# Patient Record
Sex: Male | Born: 1990 | Race: White | Hispanic: No | Marital: Single | Smoking: Never smoker
Health system: Southern US, Community
[De-identification: ages and names within clinical notes are randomized; demographics above are authoritative.]

---
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
## 2016-10-13 ENCOUNTER — Other Ambulatory Visit
Admission: RE | Admit: 2016-10-13 | Discharge: 2016-10-13 | Disposition: A | Discharge status: Home / Self Care | Payer: BLUE CROSS/BLUE SHIELD | Source: Ambulatory Visit | Attending: Gastroenterology | Admitting: Gastroenterology

## 2016-10-13 DIAGNOSIS — R197 Diarrhea, unspecified: Secondary | ICD-10-CM | POA: Diagnosis present

## 2016-10-13 LAB — GASTROINTESTINAL PANEL BY PCR, STOOL (REPLACES STOOL CULTURE)
Adenovirus F40/41: NOT DETECTED
Astrovirus: NOT DETECTED
CRYPTOSPORIDIUM: NOT DETECTED
CYCLOSPORA CAYETANENSIS: NOT DETECTED
Campylobacter species: NOT DETECTED
ENTAMOEBA HISTOLYTICA: NOT DETECTED
ENTEROAGGREGATIVE E COLI (EAEC): NOT DETECTED
Enteropathogenic E coli (EPEC): NOT DETECTED
Enterotoxigenic E coli (ETEC): NOT DETECTED
GIARDIA LAMBLIA: NOT DETECTED
NOROVIRUS GI/GII: NOT DETECTED
Plesimonas shigelloides: NOT DETECTED
Rotavirus A: NOT DETECTED
SALMONELLA SPECIES: NOT DETECTED
SAPOVIRUS (I, II, IV, AND V): NOT DETECTED
SHIGELLA/ENTEROINVASIVE E COLI (EIEC): NOT DETECTED
Shiga like toxin producing E coli (STEC): NOT DETECTED
VIBRIO CHOLERAE: NOT DETECTED
VIBRIO SPECIES: NOT DETECTED
YERSINIA ENTEROCOLITICA: NOT DETECTED

## 2016-10-13 LAB — C DIFFICILE QUICK SCREEN W PCR REFLEX
C DIFFICILE (CDIFF) TOXIN: NEGATIVE
C Diff antigen: NEGATIVE
C Diff interpretation: NOT DETECTED

## 2017-12-27 ENCOUNTER — Emergency Department: Payer: BLUE CROSS/BLUE SHIELD

## 2017-12-27 ENCOUNTER — Encounter: Payer: Self-pay | Admitting: Emergency Medicine

## 2017-12-27 ENCOUNTER — Ambulatory Visit: Payer: Self-pay

## 2017-12-27 ENCOUNTER — Emergency Department
Admission: EM | Admit: 2017-12-27 | Discharge: 2017-12-27 | Disposition: A | Discharge status: Home / Self Care | Payer: BLUE CROSS/BLUE SHIELD | Attending: Emergency Medicine | Admitting: Emergency Medicine

## 2017-12-27 DIAGNOSIS — G43909 Migraine, unspecified, not intractable, without status migrainosus: Secondary | ICD-10-CM | POA: Diagnosis not present

## 2017-12-27 DIAGNOSIS — G4751 Confusional arousals: Secondary | ICD-10-CM | POA: Insufficient documentation

## 2017-12-27 DIAGNOSIS — R51 Headache: Secondary | ICD-10-CM | POA: Diagnosis present

## 2017-12-27 DIAGNOSIS — G43109 Migraine with aura, not intractable, without status migrainosus: Secondary | ICD-10-CM

## 2017-12-27 LAB — TROPONIN I
TROPONIN I: 0.03 ng/mL — AB (ref <0.03)
TROPONIN I: 0.03 ng/mL — AB (ref <0.03)

## 2017-12-27 LAB — CBC
HCT: 42.1 % (ref 40.0–52.0)
HEMOGLOBIN: 14.6 g/dL (ref 13.0–18.0)
MCH: 30.7 pg (ref 26.0–34.0)
MCHC: 34.8 g/dL (ref 32.0–36.0)
MCV: 88.1 fL (ref 80.0–100.0)
Platelets: 242 10*3/uL (ref 150–440)
RBC: 4.77 MIL/uL (ref 4.40–5.90)
RDW: 12.3 % (ref 11.5–14.5)
WBC: 6 10*3/uL (ref 3.8–10.6)

## 2017-12-27 LAB — BASIC METABOLIC PANEL
Anion gap: 8 (ref 5–15)
BUN: 20 mg/dL (ref 6–20)
CALCIUM: 9 mg/dL (ref 8.9–10.3)
CO2: 26 mmol/L (ref 22–32)
Chloride: 103 mmol/L (ref 101–111)
Creatinine, Ser: 1.15 mg/dL (ref 0.61–1.24)
GFR calc Af Amer: >60 mL/min (ref >60)
GLUCOSE: 126 mg/dL — AB (ref 65–99)
Potassium: 3.9 mmol/L (ref 3.5–5.1)
SODIUM: 137 mmol/L (ref 135–145)

## 2017-12-27 NOTE — Telephone Encounter (Signed)
Pt. Reports 2 days ago had an episode of dizziness, confusion, numbness and could not grasp things on the left side.Had blurred vision and headache. States "it lasted about 1 hour." No symptoms today. Pt. Instructed to go to ED for evaluation. Verbalizes understanding.  Reason for Disposition . [1] Weakness (i.e., paralysis, loss of muscle strength) of the face, arm / hand, or leg / foot on one side of the body AND [2] sudden onset AND [3] brief (now gone)  Answer Assessment - Initial Assessment Questions 1. SYMPTOM: "What is the main symptom you are concerned about?" (e.g., weakness, numbness)     Numbness on left side - could not grab anything . 2 days ago 2. ONSET: "When did this start?" (minutes, hours, days; while sleeping)     2 days ago 3. LAST NORMAL: "When was the last time you were normal (no symptoms)?"     3 days ago 4. PATTERN "Does this come and go, or has it been constant since it started?"  "Is it present now?"     Constant - lasted for 1 hour 5. CARDIAC SYMPTOMS: "Have you had any of the following symptoms: chest pain, difficulty breathing, palpitations?"     No 6. NEUROLOGIC SYMPTOMS: "Have you had any of the following symptoms: headache, dizziness, vision loss, double vision, changes in speech, unsteady on your feet?"     Had headache, dizzy,vision change - things were blurry. 7. OTHER SYMPTOMS: "Do you have any other symptoms?"     No 8. PREGNANCY: "Is there any chance you are pregnant?" "When was your last menstrual period?"     No  Protocols used: NEUROLOGIC DEFICIT-A-AH

## 2017-12-27 NOTE — ED Notes (Signed)

## 2017-12-27 NOTE — ED Notes (Signed)
Pt is in bed with rails upx2, on monitor, AOx4, and family is at bedside. He denies any chest pain, blurred vision, headache or N/V at this time. We will continue to monitor the pt.

## 2017-12-27 NOTE — ED Notes (Signed)
First Nurse Note:  Patient states he "had some odd symptoms" 2 days ago.  Felt dizzy 2 days ago but not at this time.

## 2017-12-27 NOTE — ED Triage Notes (Signed)
Pt reports a few days ago he started with dizziness, loss of balance and loss of vision and confusion. Pt states was worse two days ago and is a little better now.

## 2017-12-27 NOTE — ED Provider Notes (Signed)
[facility] Emergency Department Provider Note  ___________________________________________   First MD Initiated Contact with Patient 12/27/17 1110     (approximate)  I have reviewed the triage vital signs and the nursing notes.   HISTORY  Chief Complaint  Dizziness and Numbness  HPI Trevor James is a 27 y.o. male any chronic medical history who was presented to the emergency department today for evaluation for neurologic symptoms that he had 2 days ago.  He says that he lost his peripheral vision, was very confused for several hours and then experienced left upper examinee weakness and numbness.  About a half an hour after these symptoms began he also began having a headache which rose to the level of about a 7 out of 10 and was a pressure-like headache to the front of his head.  He says that he went home from work, went to sleep and then when he woke up the headache persisted.  However, the next day he said that he was symptom-free.  Says that his mother has a migraine history.  He said that prior to the onset of the symptoms he had worked out at [employer] and then taken a pre-workout supplement and is a trip eating a symptoms to the pre-workout supplement.  However, he says that he has been on the supplement for quite a long time.  Patient also experienced dizziness at the time.  Continues to be symptom-free since the symptoms several days ago.  History reviewed. No pertinent past medical history.  There are no active problems to display for this patient.   History reviewed. No pertinent surgical history.  Prior to Admission medications   Not on File    Allergies Patient has no allergy information on record.  No family history on file.  Social History Social History   Tobacco Use  . Smoking status: Not on file  Substance Use Topics  . Alcohol use: Not on file  . Drug use: Not on file    Review of Systems  Constitutional: No fever/chills Eyes:  No visual changes. ENT: No sore throat. Cardiovascular: Denies chest pain. Respiratory: Denies shortness of breath. Gastrointestinal: No abdominal pain.  No nausea, no vomiting.  No diarrhea.  No constipation. Genitourinary: Negative for dysuria. Musculoskeletal: Negative for back pain. Skin: Negative for rash. Neurological: As above   ____________________________________________   PHYSICAL EXAM:  VITAL SIGNS: ED Triage Vitals [12/27/17 0953]  Enc Vitals Group     BP 121/68     Pulse Rate 69     Resp 20     Temp 97.9 F (36.6 C)     Temp Source Oral     SpO2 99 %     Weight 230 lb (104.3 kg)     Height  (1.854 m)     Head Circumference      Peak Flow      Pain Score 0     Pain Loc      Pain Edu?      Excl. in [place]?     Constitutional: Alert and oriented. Well appearing and in no acute distress. Eyes: Conjunctivae are normal.  Head: Atraumatic. Nose: No congestion/rhinnorhea. Mouth/Throat: Mucous membranes are moist.  Neck: No stridor.   Cardiovascular: Normal rate, regular rhythm. Grossly normal heart sounds.   Respiratory: Normal respiratory effort.  No retractions. Lungs CTAB. Gastrointestinal: Soft and nontender. No distention. No CVA tenderness. Musculoskeletal: No lower extremity tenderness nor edema.  No joint effusions. Neurologic:  Normal speech and language. No gross focal neurologic deficits are appreciated. Skin:  Skin is warm, dry and intact. No rash noted. Psychiatric: Mood and affect are normal. Speech and behavior are normal.  ____________________________________________   LABS (all labs ordered are listed, but only abnormal results are displayed)  Labs Reviewed  BASIC METABOLIC PANEL - Abnormal; Notable for the following components:      Result Value   Glucose, Bld 126 (*)    All other components within normal limits  TROPONIN I - Abnormal; Notable for the following components:   Troponin I 0.03 (*)    All other components within normal  limits  TROPONIN I - Abnormal; Notable for the following components:   Troponin I 0.03 (*)    All other components within normal limits  CBC  URINALYSIS, COMPLETE (UACMP) WITH MICROSCOPIC  CBG MONITORING, ED   ____________________________________________  EKG  ED ECG REPORT I, Arelia Longest, the attending physician, personally viewed and interpreted this ECG.   Date: 12/27/2017  EKG Time: 0958  Rate: 66  Rhythm: normal sinus rhythm  Axis: Normal  Intervals:none  ST&T Change: No ST segment elevation or depression.  No abnormal T wave inversion.  ____________________________________________  RADIOLOGY  No acute finding head CT ____________________________________________   PROCEDURES  Procedure(s) performed:   Procedures  Critical Care performed:   ____________________________________________   INITIAL IMPRESSION / ASSESSMENT AND PLAN / ED COURSE  Pertinent labs & imaging results that were available during my care of the patient were reviewed by me and considered in my medical decision making (see chart for details).  Differential diagnosis includes, but is not limited to, intracranial hemorrhage, meningitis/encephalitis, previous head trauma, cavernous venous thrombosis, tension headache, temporal arteritis, migraine or migraine equivalent, idiopathic intracranial hypertension, and non-specific headache. As part of my medical decision making, I reviewed the following data within the electronic MEDICAL RECORD NUMBER Old chart reviewed  ----------------------------------------- 2:54 PM on 12/27/2017 -----------------------------------------  Patient continues to be symptomatic.  Troponin of 0.032.  History consistent with complicated migraine.  Patient does not want prescription meds to go home with.  We will give him follow-up with cardiology as well as neurology.  I do not believe that he requires further testing or admission to the hospital.  Patient is  understanding of the diagnosis as well as treatment plan willing to comply. ____________________________________________   FINAL CLINICAL IMPRESSION(S) / ED DIAGNOSES  complicated migraine.    NEW MEDICATIONS STARTED DURING THIS VISIT:  New Prescriptions   No medications on file     Note:  This document was prepared using Dragon voice recognition software and may include unintentional dictation errors.     Myrna Blazer, MD 12/27/17 1455

## 2018-02-23 DIAGNOSIS — G43909 Migraine, unspecified, not intractable, without status migrainosus: Secondary | ICD-10-CM | POA: Insufficient documentation

## 2018-02-23 NOTE — Progress Notes (Deleted)
Cardiology Office Note  Date:  02/23/2018   ID:  Trevor James, DOB 04/23/1991, MRN [id number]  PCP:  Patient, No Pcp Per   No chief complaint on file.   HPI:  Trevor James is a 27 y.o. male   emergency department 12/27/2017  neurologic symptoms lost his peripheral vision, was very confused for several hours and then experienced left upper examinee weakness and numbness.   headache which rose to the level of about a 7 out of 10 and was a pressure-like headache to the front of his head.   went home from work,  went to sleep   headache persisted.   next day he said that he was symptom-free.    mother has a migraine history.    had worked out at [employer]  taken a pre-workout supplement   symptom-free since the symptoms several days ago.   History consistent with complicated migraine.   Elevated troponin, 0.03   PMH:   has no past medical history on file.  PSH:   No past surgical history on file.  No current outpatient medications on file.   No current facility-administered medications for this visit.      Allergies:   Patient has no allergy information on record.   Social History:  The patient     Family History:   family history is not on file.    Review of Systems: ROS   PHYSICAL EXAM: VS:  There were no vitals taken for this visit. , BMI There is no height or weight on file to calculate BMI. GEN: Well nourished, well developed, in no acute distress  HEENT: normal  Neck: no JVD, carotid bruits, or masses Cardiac: RRR; no murmurs, rubs, or gallops,no edema  Respiratory:  clear to auscultation bilaterally, normal work of breathing GI: soft, nontender, nondistended, + BS MS: no deformity or atrophy  Skin: warm and dry, no rash Neuro:  Strength and sensation are intact Psych: euthymic mood, full affect    Recent Labs: 12/27/2017: BUN 20; Creatinine, Ser 1.15; Hemoglobin 14.6; Platelets 242; Potassium 3.9; Sodium 137    Lipid Panel No results found  for: CHOL, HDL, LDLCALC, TRIG    Wt Readings from Last 3 Encounters:  12/27/17 230 lb (104.3 kg)       ASSESSMENT AND PLAN:  No diagnosis found.   Disposition:   F/U  6 months  No orders of the defined types were placed in this encounter.    Signed, Dossie Arbourim Gollan, M.D., Ph.D. 02/23/2018  [facility], [place] [phone]

## 2018-02-25 ENCOUNTER — Encounter

## 2018-02-25 ENCOUNTER — Ambulatory Visit: Payer: BLUE CROSS/BLUE SHIELD | Admitting: Cardiovascular Disease

## 2018-02-26 ENCOUNTER — Encounter: Payer: Self-pay | Admitting: Cardiovascular Disease

## 2018-12-03 IMAGING — CT CT HEAD W/O CM
3 series · 16 of 47 positions shown, 19 images · non-contrast
Comparison: None.

CLINICAL DATA: Headache, dizziness, blurry vision.

EXAM:
CT HEAD WITHOUT CONTRAST
TECHNIQUE: Contiguous axial images were obtained from the base of the skull
through the vertex without intravenous contrast.

[Series 2: head wo · axial · 0.41mm/px · z∈[-121,+4]mm · 10 of 30 slices shown, 13 images]
[im 3/30  brain]
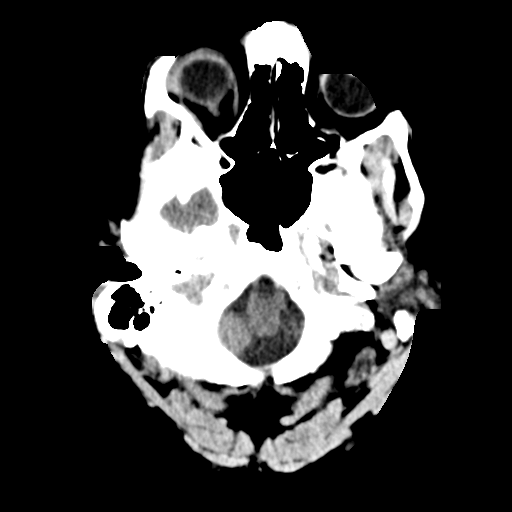
[im 3/30  bone]
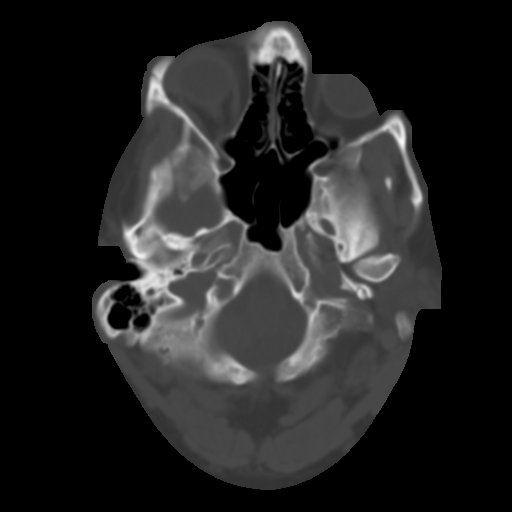
[im 6/30  brain]
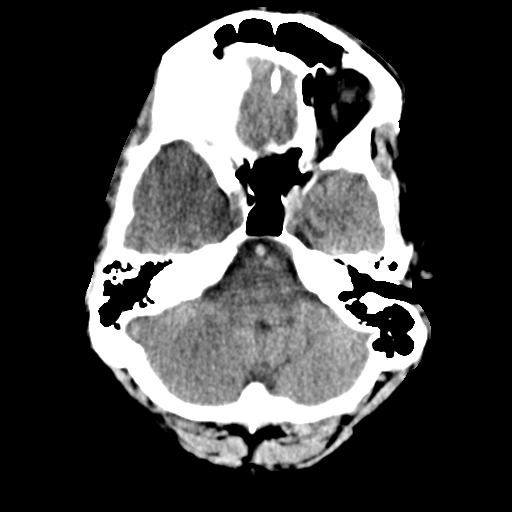
[im 9/30  brain]
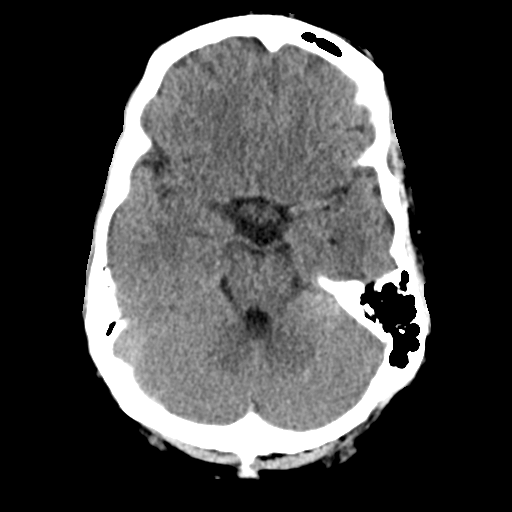
[im 11/30  brain]
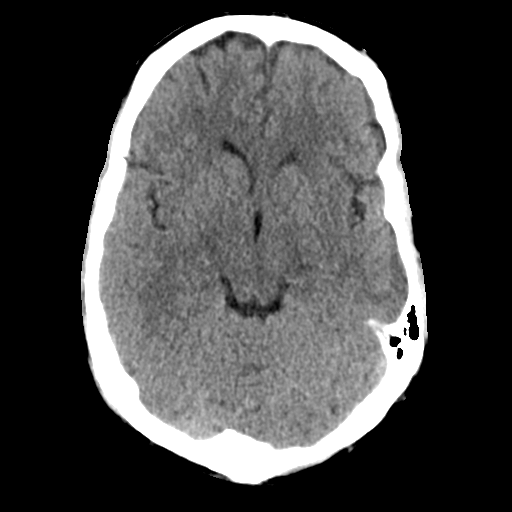
[im 14/30  brain]
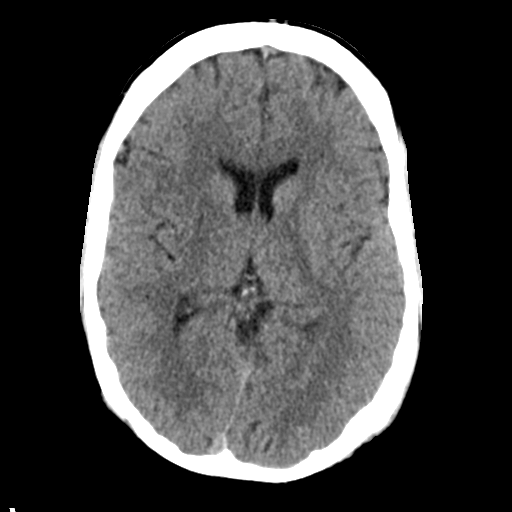
[im 14/30  bone]
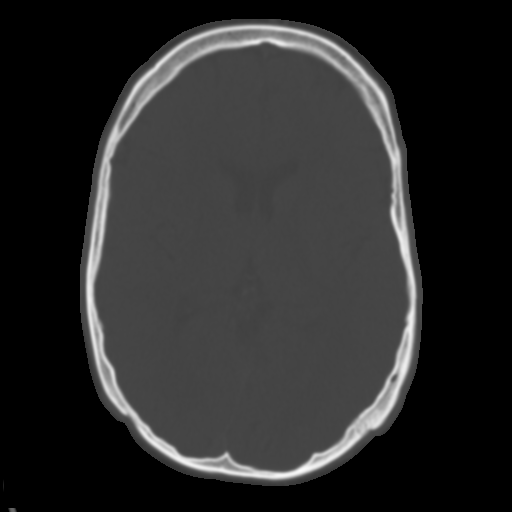
[im 17/30  brain]
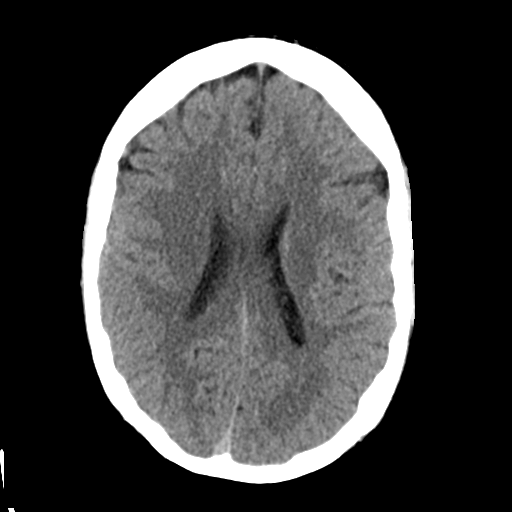
[im 20/30  brain]
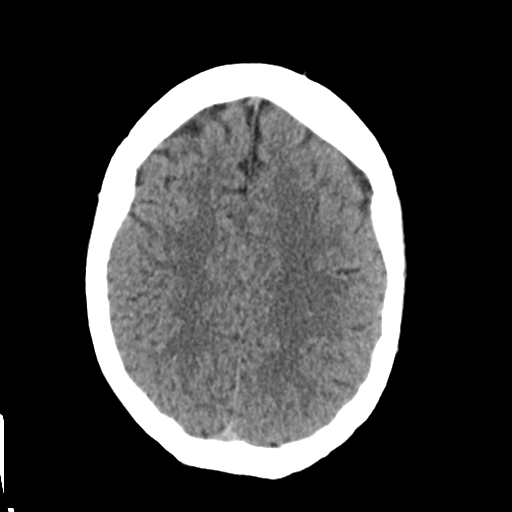
[im 23/30  brain]
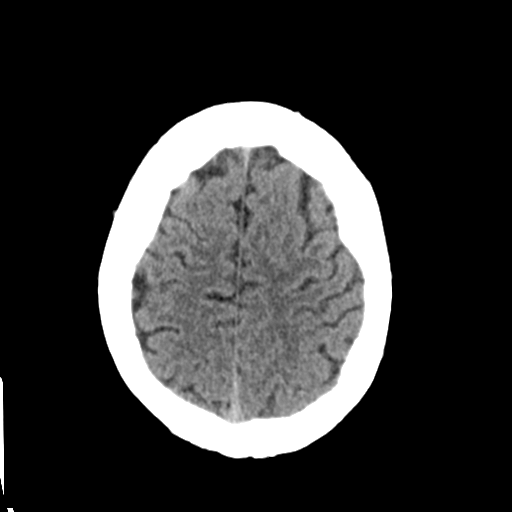
[im 25/30  brain]
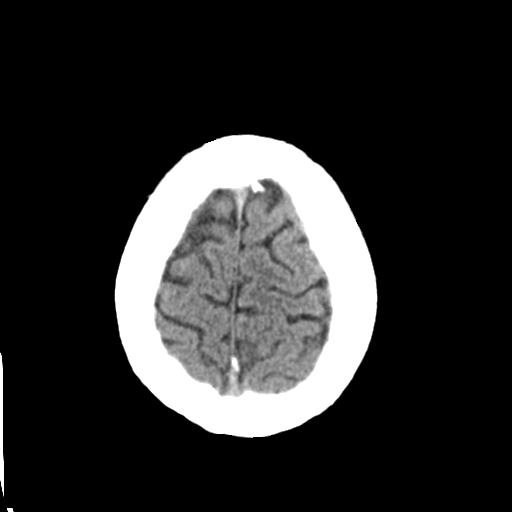
[im 25/30  bone]
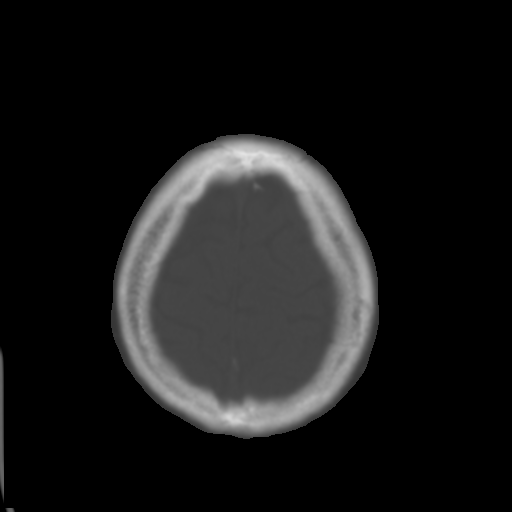
[im 28/30  brain]
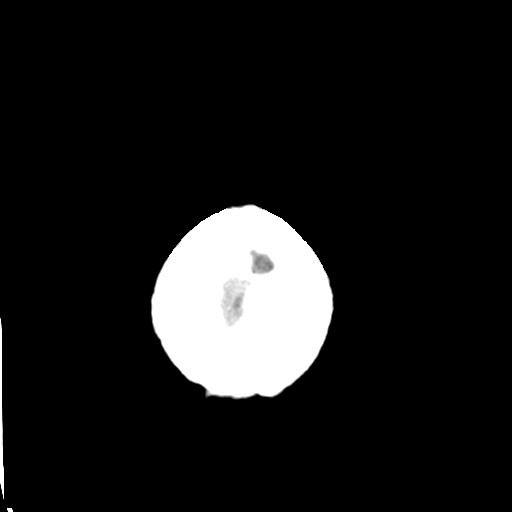

[Series 4: coronal soft tissue · coronal · 0.30mm/px · 3 of 66 slices shown]
[im 22/66  brain]
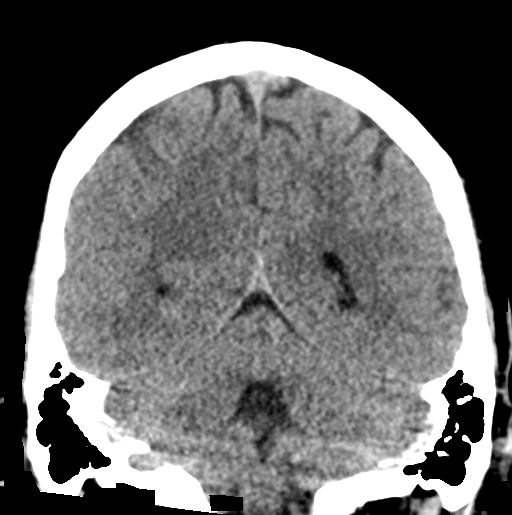
[im 29/66  brain]
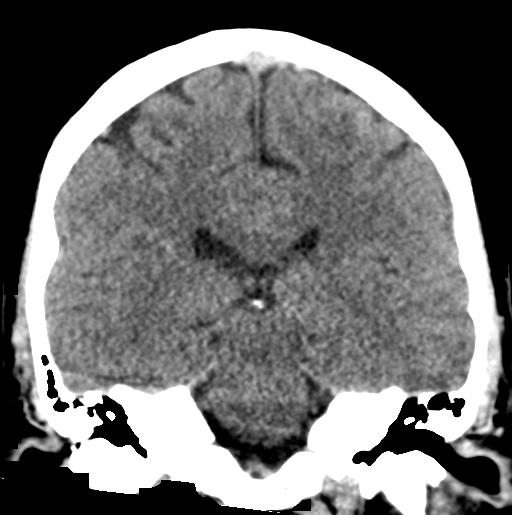
[im 37/66  brain]
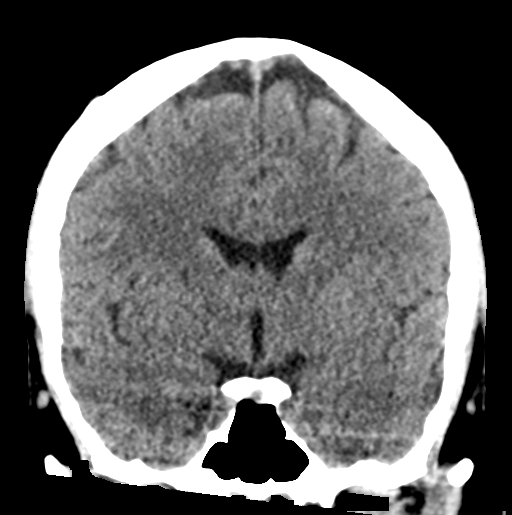

[Series 5: sagittal soft tissue · sagittal · 0.30mm/px · 3 of 52 slices shown]
[im 18/52  brain]
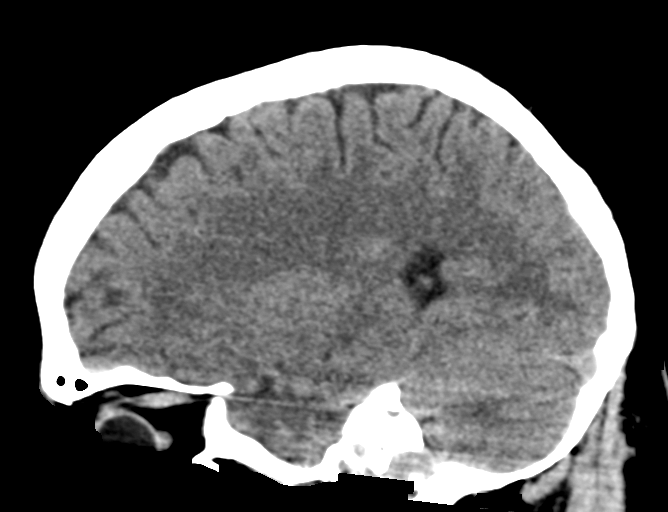
[im 26/52  brain]
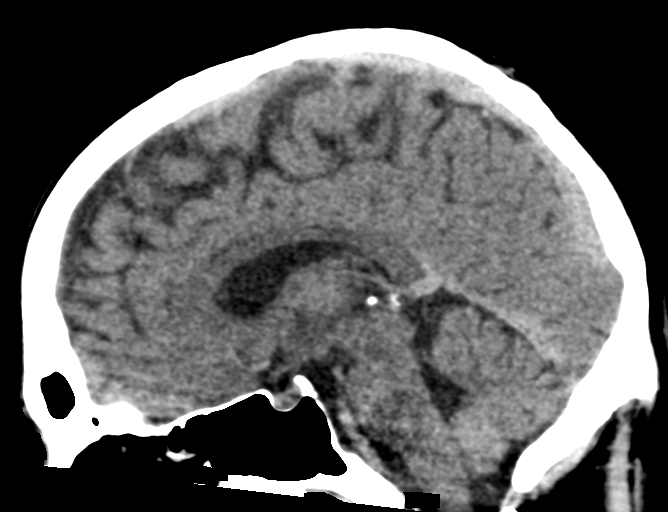
[im 35/52  brain]
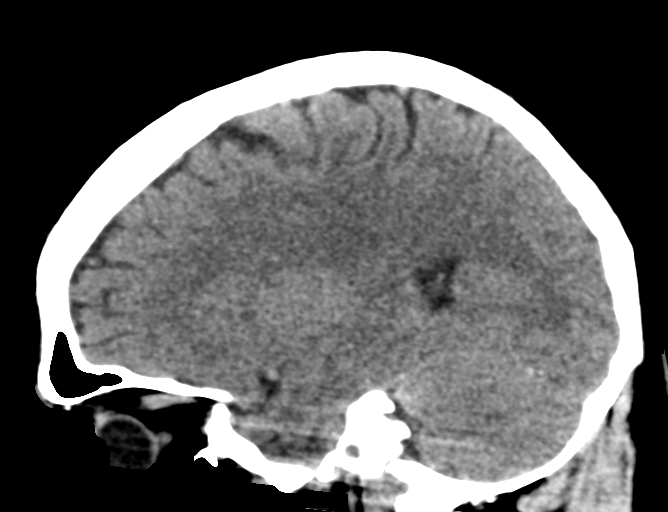

[16 of 47 positions shown; findings below may reference images not displayed]

FINDINGS: Brain: No evidence of acute infarction, hemorrhage, hydrocephalus,
extra-axial collection or mass lesion/mass effect.

Vascular: No hyperdense vessel or unexpected calcification.

Skull: Normal. Negative for fracture or focal lesion.

Sinuses/Orbits: No acute finding.

Other: None.
IMPRESSION: Normal head CT.

## 2022-10-22 ENCOUNTER — Ambulatory Visit
Admission: EM | Admit: 2022-10-22 | Discharge: 2022-10-22 | Disposition: A | Discharge status: Home / Self Care | Payer: Managed Care, Other (non HMO) | Attending: Nurse Practitioner | Admitting: Nurse Practitioner

## 2022-10-22 DIAGNOSIS — R197 Diarrhea, unspecified: Secondary | ICD-10-CM

## 2022-10-22 NOTE — ED Provider Notes (Signed)
[facility]    CSN: [id number] Arrival date & time: 10/22/22  1106      History   Chief Complaint Chief Complaint  Patient presents with   Abdominal Pain    HPI Trevor James is a 32 y.o. male presents for evaluation of diarrhea.  Patient reports 4 weeks of worsening nonbloody partially formed/liquid diarrhea.  States he is having 4-6 episodes a day.  Endorses abdominal pain prior to onset of diarrhea as well as feeling that he is not completely emptying his bowels.  He denies any fevers, chills, travel outside the country, change in diet.  Nuys any weight loss.  Denies any history of Crohn's, IBS, diverticulitis.  He does not have a PCP.  He has not taken any OTC medications for symptoms.  No other concerns at this time.   Abdominal Pain Associated symptoms: diarrhea     History reviewed. No pertinent past medical history.  Patient Active Problem List   Diagnosis Date Noted   Migraine 02/23/2018    History reviewed. No pertinent surgical history.     Home Medications    Prior to Admission medications   Not on File    Family History History reviewed. No pertinent family history.  Social History Social History   Tobacco Use   Smoking status: Never   Smokeless tobacco: Never  Substance Use Topics   Alcohol use: Yes    Comment: occ   Drug use: Never     Allergies   Patient has no known allergies.   Review of Systems Review of Systems  Gastrointestinal:  Positive for abdominal pain and diarrhea.     Physical Exam Triage Vital Signs ED Triage Vitals  Enc Vitals Group     BP 10/22/22 1126 127/81     Pulse Rate 10/22/22 1126 67     Resp 10/22/22 1126 18     Temp 10/22/22 1126 98.6 F (37 C)     Temp Source 10/22/22 1126 Oral     SpO2 10/22/22 1126 96 %     Weight --      Height --      Head Circumference --      Peak Flow --      Pain Score 10/22/22 1125 2     Pain Loc --      Pain Edu? --      Excl. in [place]? --    No data  found.  Updated Vital Signs BP 127/81 (BP Location: Right Arm)   Pulse 67   Temp 98.6 F (37 C) (Oral)   Resp 18   SpO2 96%   Visual Acuity Right Eye Distance:   Left Eye Distance:   Bilateral Distance:    Right Eye Near:   Left Eye Near:    Bilateral Near:     Physical Exam Vitals and nursing note reviewed.  Constitutional:      Appearance: Normal appearance.  Eyes:     Pupils: Pupils are equal, round, and reactive to light.  Cardiovascular:     Rate and Rhythm: Normal rate.  Pulmonary:     Effort: Pulmonary effort is normal.  Abdominal:     General: Bowel sounds are normal.     Palpations: Abdomen is soft. There is no hepatomegaly or splenomegaly.     Tenderness: There is no abdominal tenderness. There is no right CVA tenderness or left CVA tenderness. Negative signs include Rovsing's sign and McBurney's sign.  Skin:    General: Skin  is warm and dry.  Neurological:     General: No focal deficit present.     Mental Status: He is alert and oriented to person, place, and time.  Psychiatric:        Mood and Affect: Mood normal.        Behavior: Behavior normal.      UC Treatments / Results  Labs (all labs ordered are listed, but only abnormal results are displayed) Labs Reviewed - No data to display  EKG   Radiology No results found.  Procedures Procedures (including critical care time)  Medications Ordered in UC Medications - No data to display  Initial Impression / Assessment and Plan / UC Course  I have reviewed the triage vital signs and the nursing notes.  Pertinent labs & imaging results that were available during my care of the patient were reviewed by me and considered in my medical decision making (see chart for details).     Discussed exam and symptoms with patient.  Abdominal exam unremarkable. Will do stool culture given length of symptoms.  Will contact if this is positive He does not have a PCP, strongly encouraged to establish with a  PCP for further evaluation of symptoms and overall health maintenance.  Information on how to establish with one was provided to patient Hydration encouraged ER precautions reviewed and patient verbalized understanding Final Clinical Impressions(s) / UC Diagnoses   Final diagnoses:  Diarrhea, unspecified type     Discharge Instructions      The clinic will contact you once results of your stool testing is available if there is any positive results Please follow-up with a PCP for further evaluation of your symptoms Please go to the ER for any worsening symptoms   ED Prescriptions   None    PDMP not reviewed this encounter.   Melynda Ripple, NP 10/22/22 1146

## 2022-10-22 NOTE — Discharge Instructions (Addendum)
The clinic will contact you once results of your stool testing is available if there is any positive results Please follow-up with a PCP for further evaluation of your symptoms Please go to the ER for any worsening symptoms

## 2022-10-22 NOTE — ED Triage Notes (Signed)
Pt presents with c/o intestinal pain and uncontrolled bowel movements x 4 days. States the pain has worsened, pain comes and goes. States he has pain in the lower abdomen.

## 2022-10-23 ENCOUNTER — Ambulatory Visit
Admission: EM | Admit: 2022-10-23 | Discharge: 2022-10-23 | Disposition: A | Discharge status: Home / Self Care | Payer: Managed Care, Other (non HMO) | Attending: Urgent Care | Admitting: Urgent Care

## 2022-10-23 DIAGNOSIS — R197 Diarrhea, unspecified: Secondary | ICD-10-CM | POA: Diagnosis present

## 2022-10-23 NOTE — ED Provider Notes (Signed)
Nurse visit only. Need to place lab order into system.    Jaynee Eagles, [place] 10/23/22 1123

## 2022-10-24 ENCOUNTER — Telehealth: Payer: Self-pay

## 2022-10-24 ENCOUNTER — Telehealth (HOSPITAL_COMMUNITY): Payer: Self-pay | Admitting: Emergency Medicine

## 2022-10-24 LAB — GASTROINTESTINAL PANEL BY PCR, STOOL (REPLACES STOOL CULTURE)
Adenovirus F40/41: NOT DETECTED
Astrovirus: NOT DETECTED
Campylobacter species: NOT DETECTED
Cryptosporidium: NOT DETECTED
Cyclospora cayetanensis: NOT DETECTED
E. coli O157: NOT DETECTED
Entamoeba histolytica: NOT DETECTED
Enteroaggregative E coli (EAEC): NOT DETECTED
Enterotoxigenic E coli (ETEC): NOT DETECTED
Giardia lamblia: NOT DETECTED
Norovirus GI/GII: NOT DETECTED
Plesimonas shigelloides: NOT DETECTED
Rotavirus A: NOT DETECTED
Salmonella species: NOT DETECTED
Sapovirus (I, II, IV, and V): DETECTED — AB
Shiga like toxin producing E coli (STEC): DETECTED — AB
Shigella/Enteroinvasive E coli (EIEC): NOT DETECTED
Vibrio cholerae: NOT DETECTED
Vibrio species: NOT DETECTED
Yersinia enterocolitica: NOT DETECTED

## 2022-10-24 MED ORDER — CIPROFLOXACIN HCL 500 MG PO TABS: 500.0000 mg | ORAL_TABLET | Freq: Two times a day (BID) | ORAL | 0 refills | Status: AC

## 2022-10-24 NOTE — Telephone Encounter (Signed)
Caryl Pina from [employer] called and stated that the c-dif panel can't be ran due to stool being solid but the GI panel can be ran.
